# Patient Record
Sex: Male | Born: 1955
Health system: Southern US, Community
[De-identification: ages and names within clinical notes are randomized; demographics above are authoritative.]

---

## 2015-04-02 LAB — HEMOGLOBIN A1C
HEMOGLOBIN A1C: 5.3
Hemoglobin A1C: 5.3

## 2015-10-07 ENCOUNTER — Telehealth: Payer: Self-pay | Admitting: Family Medicine

## 2015-10-07 NOTE — Telephone Encounter (Signed)
New pt records received from [facility]. Pt has appt Monday 10/12/2015. Sending back for review. Please note what needs to be abstracted or scanned.

## 2015-10-12 ENCOUNTER — Ambulatory Visit (INDEPENDENT_AMBULATORY_CARE_PROVIDER_SITE_OTHER): Payer: Self-pay | Admitting: Family Medicine

## 2015-10-12 ENCOUNTER — Encounter: Payer: Self-pay | Admitting: Family Medicine

## 2015-10-12 VITALS — BP 118/76 | HR 65 | Wt 241.1 lb

## 2015-10-12 DIAGNOSIS — K8020 Calculus of gallbladder without cholecystitis without obstruction: Secondary | ICD-10-CM

## 2015-10-12 DIAGNOSIS — B182 Chronic viral hepatitis C: Secondary | ICD-10-CM | POA: Insufficient documentation

## 2015-10-12 NOTE — Progress Notes (Signed)
   Subjective:    Patient ID: Zachary Sutton, male    DOB: 1955-08-25, 60 y.o.   MRN: [id number]  HPI He is here for an initial visit. He was recently released from prison. He was diagnosed with hepatitis C in the mid 90s and given interferon for 1 year however did not get cured. He has been in the process of being evaluated for some maneuver medications however the prison system did not accommodate him in that regard. He did bring in all of his medical information. The medical information was reviewed. He does have type IA. Genotyping was done and he is sensitive to all the new hepatitis C regimens.C doesn't have insurance he is also checked on getting it free and would like me to prescribe it. Medical record also indicates that he does have cholelithiasis however he has had no GI symptoms.   Review of Systems     Objective:   Physical Exam  Alert and in no distress otherwise not examined His medical chart was reviewed.     Assessment & Plan:  Chronic hepatitis C without hepatic coma (HCC) case was discussed with the hepatitis C clinic. This does seem to be a fairly straightforward regimen and the side effects of the medication are quite minimal. I will review the information and expedite him getting the medication. Recommend that he come back after starting on the medication at 8 weeks for blood work and then 3 months after finishing it. I will discuss possible side effects of medications with him when I review the website more thoroughly.

## 2015-10-14 ENCOUNTER — Telehealth: Payer: Self-pay | Admitting: Family Medicine

## 2015-10-14 NOTE — Telephone Encounter (Signed)
Printed off Pt Assistance form for pt to complete for Zepatier for Hepatitis C treatment and this was mailed to pt

## 2015-10-15 ENCOUNTER — Telehealth: Payer: Self-pay | Admitting: Family Medicine

## 2015-10-15 NOTE — Telephone Encounter (Signed)
Called pt reached voice mail.  We need to know what pharmacy, we need patients signature on form and then we need to fax the form.

## 2015-10-20 NOTE — Telephone Encounter (Signed)
Pt returned Pt Assistance form, this was completed and faxed

## 2015-12-01 ENCOUNTER — Telehealth: Payer: Self-pay | Admitting: Family Medicine

## 2015-12-01 NOTE — Telephone Encounter (Signed)
PT ASSISTANCE ZEPATIER approved thru 10/25/16

## 2016-02-01 NOTE — Telephone Encounter (Signed)
Left message for pt, per [employer] patient is required to call in for his refills

## 2016-02-12 ENCOUNTER — Telehealth: Payer: Self-pay | Admitting: Family Medicine

## 2016-02-12 NOTE — Telephone Encounter (Signed)
Pt states he completed the Hep C medication 3 weeks ago, and he would like to see what his viral load is.  He wanted to know how much it would cost.  Let me know if he can have that drawn and which test it is and I will let him know the cost

## 2016-02-15 NOTE — Telephone Encounter (Signed)
Schedule him to come in for hep C viral load

## 2016-02-15 NOTE — Telephone Encounter (Signed)
He should follow up the Hep C clinic concerning this

## 2016-02-15 NOTE — Telephone Encounter (Signed)
Dr.Lalonde he has never been to Hep c clinic and all he wants is the blood test he still doesn't have ins.

## 2016-02-15 NOTE — Telephone Encounter (Signed)
Left message for pt to call me back 

## 2016-02-17 ENCOUNTER — Other Ambulatory Visit: Payer: Self-pay

## 2016-02-18 ENCOUNTER — Other Ambulatory Visit: Payer: Self-pay

## 2016-02-18 DIAGNOSIS — B1920 Unspecified viral hepatitis C without hepatic coma: Secondary | ICD-10-CM

## 2016-02-18 NOTE — Telephone Encounter (Signed)
Order has been place in future orders

## 2016-02-18 NOTE — Telephone Encounter (Signed)
Pt informed of order and cost of 61$ and with 55% discount test is 27.45

## 2016-02-26 ENCOUNTER — Other Ambulatory Visit: Payer: Self-pay

## 2016-02-26 DIAGNOSIS — B1920 Unspecified viral hepatitis C without hepatic coma: Secondary | ICD-10-CM

## 2016-02-26 DIAGNOSIS — B182 Chronic viral hepatitis C: Secondary | ICD-10-CM

## 2016-02-29 LAB — HEPATITIS C RNA QUANTITATIVE: HCV Quantitative: NOT DETECTED IU/mL (ref <15)

## 2016-09-06 ENCOUNTER — Encounter: Payer: Self-pay | Admitting: Family Medicine

## 2016-09-06 ENCOUNTER — Ambulatory Visit (INDEPENDENT_AMBULATORY_CARE_PROVIDER_SITE_OTHER): Payer: BLUE CROSS/BLUE SHIELD | Admitting: Family Medicine

## 2016-09-06 VITALS — BP 130/80 | HR 78 | Wt 251.0 lb

## 2016-09-06 DIAGNOSIS — J209 Acute bronchitis, unspecified: Secondary | ICD-10-CM | POA: Diagnosis not present

## 2016-09-06 DIAGNOSIS — S161XXA Strain of muscle, fascia and tendon at neck level, initial encounter: Secondary | ICD-10-CM

## 2016-09-06 MED ORDER — AMOXICILLIN 875 MG PO TABS: 875.0000 mg | ORAL_TABLET | Freq: Two times a day (BID) | ORAL | 0 refills | Status: DC

## 2016-09-06 MED ORDER — HYDROCOD POLST-CPM POLST ER 10-8 MG/5ML PO SUER: 5.0000 mL | Freq: Two times a day (BID) | ORAL | Status: DC | PRN
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
## 2016-09-06 NOTE — Patient Instructions (Signed)
Heat for 20 minutes 3 times per day with stretching after that for your neck. 2 Aleve twice per day for the neck pain.

## 2016-09-20 ENCOUNTER — Ambulatory Visit (INDEPENDENT_AMBULATORY_CARE_PROVIDER_SITE_OTHER): Payer: BLUE CROSS/BLUE SHIELD | Admitting: Family Medicine

## 2016-09-20 ENCOUNTER — Encounter: Payer: Self-pay | Admitting: Family Medicine

## 2016-09-20 VITALS — BP 126/80 | HR 78 | Resp 18 | Wt 251.2 lb

## 2016-09-20 DIAGNOSIS — S161XXA Strain of muscle, fascia and tendon at neck level, initial encounter: Secondary | ICD-10-CM | POA: Diagnosis not present

## 2016-09-20 DIAGNOSIS — J209 Acute bronchitis, unspecified: Secondary | ICD-10-CM | POA: Diagnosis not present

## 2016-09-20 DIAGNOSIS — N529 Male erectile dysfunction, unspecified: Secondary | ICD-10-CM

## 2016-09-20 MED ORDER — AMOXICILLIN-POT CLAVULANATE 875-125 MG PO TABS: 1.0000 | ORAL_TABLET | Freq: Two times a day (BID) | ORAL | 0 refills | Status: DC

## 2016-09-20 MED ORDER — SILDENAFIL CITRATE 20 MG PO TABS: 20.0000 mg | ORAL_TABLET | Freq: Three times a day (TID) | ORAL | 5 refills | Status: DC

## 2016-09-20 NOTE — Progress Notes (Signed)
   Subjective:    Patient ID: Zachary Sutton, male    DOB: 02/24/1956, 61 y.o.   MRN: [id number]  HPI He is here for a recheck. He is still having difficulty with coughing, postnasal drainage, nasal congestion but states that he is now 80% better. He is also still having difficulty with bilateral stiffness in his neck. No pain, numbness or tingling. He states his neck is roughly 80% better. He also has a previous history of difficulty with erectile dysfunction and did use Viagra in the past. He would like to see if he can get it at a less expensive price.  Review of Systems     Objective:   Physical Exam Alert and in no distress. Tympanic membranes and canals are normal. Pharyngeal area is normal. Neck is supple without adenopathy or thyromegaly. Cardiac exam shows a regular sinus rhythm without murmurs or gallops. Lungs are clear to auscultation. Full motion of the neck. Slight tenderness to palpation in the posterior cervical muscles with a trigger point present in the left trapezius.       Assessment & Plan:  Acute bronchitis, unspecified organism - Plan: amoxicillin-clavulanate (AUGMENTIN) 875-125 MG tablet  Neck strain, initial encounter  Erectile dysfunction, unspecified erectile dysfunction type - Plan: sildenafil (REVATIO) 20 MG tablet Prescription for sildenafil given. He will check with his pharmacy on the cost. Also recommend heat and stretching for his neck. He will return here in 2 weeks for reevaluation.

## 2016-09-20 NOTE — Patient Instructions (Signed)
Heat for 20 minutes 3 times per day and do some stretching after that.

## 2016-09-22 ENCOUNTER — Telehealth: Payer: Self-pay | Admitting: Family Medicine

## 2016-09-22 ENCOUNTER — Other Ambulatory Visit: Payer: Self-pay

## 2016-09-22 DIAGNOSIS — N529 Male erectile dysfunction, unspecified: Secondary | ICD-10-CM

## 2016-09-22 MED ORDER — SILDENAFIL CITRATE 20 MG PO TABS: 20.0000 mg | ORAL_TABLET | Freq: Three times a day (TID) | ORAL | 5 refills | Status: DC

## 2016-09-22 NOTE — Telephone Encounter (Signed)
sent 

## 2016-09-22 NOTE — Telephone Encounter (Signed)
Make it so

## 2016-09-22 NOTE — Telephone Encounter (Signed)
Patient called generic viagra rx has to be sent to [facility] [phone]  30 day supply

## 2016-10-11 ENCOUNTER — Ambulatory Visit: Payer: BLUE CROSS/BLUE SHIELD | Admitting: Family Medicine

## 2016-11-02 ENCOUNTER — Telehealth: Payer: Self-pay | Admitting: Family Medicine

## 2016-11-02 DIAGNOSIS — N529 Male erectile dysfunction, unspecified: Secondary | ICD-10-CM

## 2016-11-02 MED ORDER — SILDENAFIL CITRATE 20 MG PO TABS: 20.0000 mg | ORAL_TABLET | Freq: Three times a day (TID) | ORAL | 5 refills | Status: DC

## 2016-11-02 NOTE — Telephone Encounter (Signed)
Pt called and stated that he is having issues with insurance filling sildenafil. He would like a 30 day supply sent into [facility] [place] and he will pay for out of pocket.

## 2016-11-02 NOTE — Telephone Encounter (Signed)
Let him know that I called it in 

## 2016-11-03 NOTE — Telephone Encounter (Signed)
Left message on VM med called in

## 2016-11-05 ENCOUNTER — Telehealth: Payer: Self-pay | Admitting: Family Medicine

## 2016-11-05 NOTE — Telephone Encounter (Signed)
P.A. SILDENAFIL  

## 2016-11-13 NOTE — Telephone Encounter (Signed)
P.A. [place], Sildenafil is only approved to treat pulmonary arterial hypertension.  Can pt be switched to Sildenafil  #90 tabs  cash pay option for $95 from [facility]?

## 2016-11-14 NOTE — Telephone Encounter (Signed)
Called pt & informed, he has a coupon card thru [facility] to pay cash out of pocket and get them cheap

## 2016-11-14 NOTE — Telephone Encounter (Signed)
Let him know what the deal is

## 2017-06-02 DIAGNOSIS — Z23 Encounter for immunization: Secondary | ICD-10-CM | POA: Diagnosis not present

## 2017-09-13 ENCOUNTER — Ambulatory Visit (INDEPENDENT_AMBULATORY_CARE_PROVIDER_SITE_OTHER): Payer: 59 | Admitting: Family Medicine

## 2017-09-13 ENCOUNTER — Encounter: Payer: Self-pay | Admitting: Family Medicine

## 2017-09-13 VITALS — BP 150/80 | HR 68 | Resp 99 | Wt 272.8 lb

## 2017-09-13 DIAGNOSIS — L03116 Cellulitis of left lower limb: Secondary | ICD-10-CM | POA: Diagnosis not present

## 2017-09-13 DIAGNOSIS — J209 Acute bronchitis, unspecified: Secondary | ICD-10-CM

## 2017-09-13 DIAGNOSIS — L821 Other seborrheic keratosis: Secondary | ICD-10-CM | POA: Diagnosis not present

## 2017-09-13 DIAGNOSIS — R030 Elevated blood-pressure reading, without diagnosis of hypertension: Secondary | ICD-10-CM | POA: Diagnosis not present

## 2017-09-13 MED ORDER — DOXYCYCLINE HYCLATE 100 MG PO TABS: 100.0000 mg | ORAL_TABLET | Freq: Two times a day (BID) | ORAL | 0 refills | Status: DC

## 2017-09-13 MED ORDER — HYDROCOD POLST-CPM POLST ER 10-8 MG/5ML PO SUER: 5.0000 mL | Freq: Two times a day (BID) | ORAL | 0 refills | Status: DC | PRN

## 2017-09-13 NOTE — Progress Notes (Signed)
   Subjective:    Patient ID: Zachary Sutton, male    DOB: 07/30/1956, 62 y.o.   MRN: [id number]  HPI He complains of a 2-week history of cough that is occasionally productive with nasal congestion but no fever, chills, sore throat, earache, rhinorrhea or sneezing.  He has been using Tussionex for this.  He also has a lesion present on the left posterior leg that he would like evaluated.  It apparently got irritated when he wore his shoes and is now draining slightly.  He also has several lesions on his anus that he has concerns about.   Review of Systems     Objective:   Physical Exam Alert and in no distress. Tympanic membranes and canals are normal. Pharyngeal area is normal. Neck is supple without adenopathy or thyromegaly. Cardiac exam shows a regular sinus rhythm without murmurs or gallops. Lungs are clear to auscultation. Exam of the penis does show 2 well-demarcated pigmented lesions present on the ventral shaft approximately 1 cm in size. Exam of the left posterior distal calf does show an area of slight erythema and drainage.       Assessment & Plan:  Acute bronchitis, unspecified organism - Plan: doxycycline (VIBRA-TABS) 100 MG tablet  Cellulitis of left lower extremity  Elevated blood pressure reading  Seborrheic keratosis I will treat the bronchitis and the cellulitis with the Vibra-Tabs and see how he does.  He is to return here in about 10 days for reevaluation.  I will also reevaluate his blood pressure at that point.  Explained that the lesions on the penis are benign and unless they grow, nothing further needs to be done.

## 2017-09-14 ENCOUNTER — Telehealth: Payer: Self-pay

## 2017-09-14 NOTE — Telephone Encounter (Signed)
Pt called about his medicine Tussionex  not being in at the pharmacy. According to chart med a\was confimed at pharmacy @7 :30 pm . Will call the pharmacy to check. Thanks [employer]

## 2017-09-26 ENCOUNTER — Ambulatory Visit (INDEPENDENT_AMBULATORY_CARE_PROVIDER_SITE_OTHER): Payer: 59 | Admitting: Family Medicine

## 2017-09-26 ENCOUNTER — Encounter: Payer: Self-pay | Admitting: Family Medicine

## 2017-09-26 VITALS — BP 132/84 | HR 65 | Wt 271.4 lb

## 2017-09-26 DIAGNOSIS — R030 Elevated blood-pressure reading, without diagnosis of hypertension: Secondary | ICD-10-CM | POA: Diagnosis not present

## 2017-09-26 DIAGNOSIS — J209 Acute bronchitis, unspecified: Secondary | ICD-10-CM | POA: Diagnosis not present

## 2017-09-26 DIAGNOSIS — R635 Abnormal weight gain: Secondary | ICD-10-CM | POA: Diagnosis not present

## 2017-09-26 MED ORDER — AMOXICILLIN-POT CLAVULANATE 875-125 MG PO TABS
1.0000 | ORAL_TABLET | Freq: Two times a day (BID) | ORAL | 0 refills | Status: DC
Start: 2017-09-26 — End: 2019-09-13

## 2017-09-26 NOTE — Progress Notes (Signed)
   Subjective:    Patient ID: Zachary RegisterJoseph K Liebig, male    DOB: 05/31/1956, 62 y.o.   MRN: [id number]  HPI He is here for a recheck.  He does state that the cellulitis in his calf is now back to normal.  The cough is only about 50% better.  He continues to have difficulty with cough and congestion but no fever, chills, sore throat.  He also has a previous history of hepatitis C and did have it treated.  His last viral load was undetectable.  He does complain of weight gain over the last several years.  He is out of prison and now for the last several years been living with his wife.  He is concerned about his liver.  Also with his last evaluation, his blood pressure was slightly elevated.   Review of Systems     Objective:   Physical Exam Alert and in no distress. Tympanic membranes and canals are normal. Pharyngeal area is normal. Neck is supple without adenopathy or thyromegaly. Cardiac exam shows a regular sinus rhythm without murmurs or gallops. Lungs are clear to auscultation. Abdominal exam shows no masses or tenderness.  I do not really detect a fluid wave.  Exam of his calf does show a healing lesion.  Blood pressure is recorded.       Assessment & Plan:  Acute bronchitis, unspecified organism - Plan: amoxicillin-clavulanate (AUGMENTIN) 875-125 MG tablet  Elevated blood pressure reading  Weight gain - Plan: CBC with Differential/Platelet, Comprehensive metabolic panel Since his last viral load was undetectable I do not feel the need to check that again.  I will place him on Augmentin to help with his cough.  Discussed blood pressure and since he is borderline uncomfortable with waiting.

## 2017-09-27 LAB — CBC WITH DIFFERENTIAL/PLATELET
BASOS: 1 %
Basophils Absolute: 0 10*3/uL (ref 0.0–0.2)
EOS (ABSOLUTE): 0.2 10*3/uL (ref 0.0–0.4)
EOS: 2 %
Hematocrit: 39.2 % (ref 37.5–51.0)
Hemoglobin: 13.8 g/dL (ref 13.0–17.7)
IMMATURE GRANS (ABS): 0 10*3/uL (ref 0.0–0.1)
IMMATURE GRANULOCYTES: 1 %
LYMPHS: 27 %
Lymphocytes Absolute: 1.7 10*3/uL (ref 0.7–3.1)
MCH: 30.8 pg (ref 26.6–33.0)
MCHC: 35.2 g/dL (ref 31.5–35.7)
MCV: 88 fL (ref 79–97)
MONOCYTES: 6 %
Monocytes Absolute: 0.4 10*3/uL (ref 0.1–0.9)
NEUTROS PCT: 63 %
Neutrophils Absolute: 4 10*3/uL (ref 1.4–7.0)
PLATELETS: 284 10*3/uL (ref 150–379)
RBC: 4.48 x10E6/uL (ref 4.14–5.80)
RDW: 13.4 % (ref 12.3–15.4)
WBC: 6.3 10*3/uL (ref 3.4–10.8)

## 2017-09-27 LAB — COMPREHENSIVE METABOLIC PANEL
ALT: 21 IU/L (ref 0–44)
AST: 19 IU/L (ref 0–40)
Albumin/Globulin Ratio: 1.3 (ref 1.2–2.2)
Albumin: 4.1 g/dL (ref 3.6–4.8)
Alkaline Phosphatase: 48 IU/L (ref 39–117)
BUN/Creatinine Ratio: 15 (ref 10–24)
BUN: 13 mg/dL (ref 8–27)
Bilirubin Total: 0.3 mg/dL (ref 0.0–1.2)
CALCIUM: 9.2 mg/dL (ref 8.6–10.2)
CO2: 25 mmol/L (ref 20–29)
CREATININE: 0.85 mg/dL (ref 0.76–1.27)
Chloride: 99 mmol/L (ref 96–106)
GFR, EST AFRICAN AMERICAN: 109 mL/min/{1.73_m2} (ref >59)
GFR, EST NON AFRICAN AMERICAN: 95 mL/min/{1.73_m2} (ref >59)
GLUCOSE: 99 mg/dL (ref 65–99)
Globulin, Total: 3.1 g/dL (ref 1.5–4.5)
Potassium: 4.5 mmol/L (ref 3.5–5.2)
Sodium: 139 mmol/L (ref 134–144)
TOTAL PROTEIN: 7.2 g/dL (ref 6.0–8.5)

## 2017-09-28 ENCOUNTER — Encounter: Payer: Self-pay | Admitting: Family Medicine

## 2018-01-05 ENCOUNTER — Encounter: Payer: Self-pay | Admitting: Family Medicine

## 2018-01-12 ENCOUNTER — Other Ambulatory Visit: Payer: Self-pay | Admitting: Family Medicine

## 2018-06-16 ENCOUNTER — Other Ambulatory Visit: Payer: Self-pay | Admitting: Family Medicine

## 2018-06-16 DIAGNOSIS — N529 Male erectile dysfunction, unspecified: Secondary | ICD-10-CM

## 2018-06-18 NOTE — Telephone Encounter (Signed)
[facility] Please advise KH 

## 2018-09-18 ENCOUNTER — Ambulatory Visit (INDEPENDENT_AMBULATORY_CARE_PROVIDER_SITE_OTHER): Payer: 59 | Admitting: Family Medicine

## 2018-09-18 DIAGNOSIS — S63632A Sprain of interphalangeal joint of right middle finger, initial encounter: Secondary | ICD-10-CM

## 2018-09-18 DIAGNOSIS — G4700 Insomnia, unspecified: Secondary | ICD-10-CM

## 2018-09-18 MED ORDER — ALPRAZOLAM 0.5 MG PO TABS: 0.5000 mg | ORAL_TABLET | Freq: Two times a day (BID) | ORAL | 0 refills | Status: DC | PRN

## 2018-09-18 NOTE — Patient Instructions (Signed)
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
## 2018-10-16 ENCOUNTER — Other Ambulatory Visit: Payer: Self-pay | Admitting: Family Medicine

## 2018-10-16 DIAGNOSIS — G4700 Insomnia, unspecified: Secondary | ICD-10-CM

## 2018-10-16 MED ORDER — ALPRAZOLAM 0.5 MG PO TABS: 0.5000 mg | ORAL_TABLET | Freq: Two times a day (BID) | ORAL | 1 refills | Status: DC | PRN

## 2018-10-16 NOTE — Telephone Encounter (Signed)
Zachary Sutton is requesting to fill pt xanax Please advise KH 

## 2018-12-28 ENCOUNTER — Ambulatory Visit (INDEPENDENT_AMBULATORY_CARE_PROVIDER_SITE_OTHER): Payer: 59 | Admitting: Family Medicine

## 2018-12-28 ENCOUNTER — Encounter: Payer: Self-pay | Admitting: Family Medicine

## 2018-12-28 ENCOUNTER — Other Ambulatory Visit: Payer: Self-pay

## 2018-12-28 DIAGNOSIS — S63632D Sprain of interphalangeal joint of right middle finger, subsequent encounter: Secondary | ICD-10-CM
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
## 2019-01-23 ENCOUNTER — Other Ambulatory Visit: Payer: Self-pay | Admitting: Family Medicine

## 2019-01-23 DIAGNOSIS — G4700 Insomnia, unspecified: Secondary | ICD-10-CM

## 2019-01-23 NOTE — Telephone Encounter (Signed)
Is this okay to refill? 

## 2019-02-04 ENCOUNTER — Other Ambulatory Visit: Payer: Self-pay

## 2019-02-04 ENCOUNTER — Encounter: Payer: Self-pay | Admitting: Family Medicine

## 2019-02-04 ENCOUNTER — Ambulatory Visit (INDEPENDENT_AMBULATORY_CARE_PROVIDER_SITE_OTHER): Payer: 59 | Admitting: Family Medicine

## 2019-02-04 ENCOUNTER — Other Ambulatory Visit: Payer: Self-pay | Admitting: Family Medicine

## 2019-02-04 ENCOUNTER — Telehealth: Payer: Self-pay | Admitting: *Deleted

## 2019-02-04 VITALS — Temp 102.0°F | Wt 245.0 lb

## 2019-02-04 DIAGNOSIS — R509 Fever, unspecified: Secondary | ICD-10-CM

## 2019-02-04 DIAGNOSIS — Z20822 Contact with and (suspected) exposure to covid-19: Secondary | ICD-10-CM

## 2019-02-04 NOTE — Patient Instructions (Signed)
Keep well-hydrated.  Go ahead and use aspirin for fever and chills.

## 2019-02-04 NOTE — Progress Notes (Signed)
   Subjective:    Patient ID: Zachary Sutton, male    DOB: May 26, 1956, 63 y.o.   MRN: [id number]  HPI Documentation for virtual telephone encounter.  Documentation for virtual audio and video telecommunications through Patrick encounter:  Interactive audio and video telecommunications were attempted between this provider and patient, however due to patient 5, they did not have access to video capability.  We continued and completed visit with audio only. The patient was located at home. The provider was located in the office. The patient did consent to this visit and is aware of possible charges through their insurance for this visit. The other persons participating in this telemedicine service were none. Time spent on call was 5 minutes and in review of previous records >10 minutes total.  This virtual service is not related to other E/M service within previous 7 days. He complains of a 2-day history of fever, chills, fatigue.  Temperature has gone up to 102.  No cough, congestion, shortness of breath, diarrhea, earache, smell or taste difference.  He would like to be COVID tested.    Review of Systems     Objective:   Physical Exam Alert and in no distress.  He visually appears slightly toxic.       Assessment & Plan:  Fever and chills - Plan: Order placed for COVID testing Keep well-hydrated.  Go ahead and use aspirin for fever and chills.

## 2019-02-04 NOTE — Telephone Encounter (Signed)
LM for patient to call back to schedule covid testing @ [phone] M-F 7a-7p. Order placed

## 2019-02-04 NOTE — Telephone Encounter (Signed)
Patient called and says he needs to schedule his covid test. Appointment scheduled for today at 36 at [facility], advised of location and to wear a mask for everyone in the vehicle, he verbalized understanding.

## 2019-02-04 NOTE — Addendum Note (Signed)
Addended by: Denita Lung on: 02/04/2019 12:13 PM   Modules accepted: Orders

## 2019-02-04 NOTE — Addendum Note (Signed)
Addended byMatilde Sprang on: 02/04/2019 12:02 PM   Modules accepted: Orders

## 2019-02-04 NOTE — Telephone Encounter (Signed)
-----   Message from Denita Lung, MD sent at 02/04/2019 10:25 AM EDT ----- 2-day history of fever, chills, fatigue but not short of breath or coughing.  Needs COVID testing.

## 2019-02-07 LAB — NOVEL CORONAVIRUS, NAA: SARS-CoV-2, NAA: NOT DETECTED

## 2019-04-20 ENCOUNTER — Other Ambulatory Visit: Payer: Self-pay | Admitting: Family Medicine

## 2019-04-20 DIAGNOSIS — G4700 Insomnia, unspecified: Secondary | ICD-10-CM

## 2019-05-15 ENCOUNTER — Other Ambulatory Visit: Payer: Self-pay | Admitting: Family Medicine

## 2019-05-15 DIAGNOSIS — N529 Male erectile dysfunction, unspecified: Secondary | ICD-10-CM

## 2019-05-15 NOTE — Telephone Encounter (Signed)
Is this okay to refill? 

## 2019-06-13 ENCOUNTER — Other Ambulatory Visit: Payer: Self-pay | Admitting: Family Medicine

## 2019-06-13 DIAGNOSIS — G4700 Insomnia, unspecified: Secondary | ICD-10-CM

## 2019-06-13 NOTE — Telephone Encounter (Signed)
[facility] is requesting to fill pt xanax . Please advise KH 

## 2019-09-13 ENCOUNTER — Encounter: Payer: Self-pay | Admitting: Family Medicine

## 2019-09-13 ENCOUNTER — Ambulatory Visit: Payer: 59 | Admitting: Family Medicine

## 2019-09-13 ENCOUNTER — Other Ambulatory Visit: Payer: Self-pay

## 2019-09-13 VITALS — Wt 235.0 lb

## 2019-09-13 DIAGNOSIS — N50812 Left testicular pain: Secondary | ICD-10-CM

## 2019-09-13 DIAGNOSIS — R197 Diarrhea, unspecified: Secondary | ICD-10-CM

## 2019-09-13 DIAGNOSIS — N50811 Right testicular pain: Secondary | ICD-10-CM

## 2019-09-13 NOTE — Progress Notes (Signed)
   Subjective:  Documentation for virtual audio and video telecommunications through [employer] encounter:  The patient was located at work. 2 patient identifiers used.  The provider was located in the office. The patient did consent to this visit and is aware of possible charges through their insurance for this visit.  The other persons participating in this telemedicine service were none.    Patient ID: Zachary Sutton, male    DOB: April 16, 1956, 64 y.o.   MRN: [id number]  HPI Chief Complaint  Patient presents with  . testsicle    testicle pain- intermittent for the last week, not swollen, hurts when sometimes walking or standing long. no blood. diarrhea- but says its not related. not sure if its a hernia or not. sometimes when coughing hard, he can feel it in the right side   He normally sees Dr. Susann Givens.  Complains of a 2 week history of bilateral testicular pain. Pain is described as a dull ache. Pain is intermittent and worse on the right when present. Pain is nonradiating. Standing long periods and certain movements makes his pain worse. Pain improves with resting or sitting.  Denies swelling, tenderness. States he has been palpating his testicles and there is no tenderness or masses. States he masturbated yesterday and hs semen looked normal.  He does have some concerned that he may have a hernia although he has not noticed a bulge.  He did not come into the office today due to him being at work and hs complaint of diarrhea in the setting of Covid pandemic. States he has loose stools from time to time related to food. Thinks this is related to something he ate. He took imodium and no longer having diarrhea.   Denies fever, chills, dizziness, chest pain, palpitations, shortness of breath, abdominal pain, back pain, N/V, or urinary symptoms.   Reviewed allergies, medications, past medical, surgical, family, and social history.    Review of Systems Pertinent positives and negatives in  the history of present illness.     Objective:   Physical Exam Wt 235 lb (106.6 kg)   Alert and oriented and in no acute distress.  Respirations unlabored.  Normal speech, mood and thought process.      Assessment & Plan:  Pain in both testicles  Diarrhea, unspecified type  Discussed limitations of a virtual visit especially for the complaint of testicular pain.  There does not appear to be any red flag symptoms such as testicular torsion and we did discuss the symptoms of this and the fact that this is an emergency. His pain is intermittent and associated with certain movements. No urinary symptoms.  He has concerns that he may have a hernia.  I discussed avoiding any strenuous activity until he is evaluated.  I also discussed symptoms of incarceration or strangulation of a hernia and that this was also an emergency. Diarrhea appears to be associated to something he ate and he does have this from time to time after eating certain foods. He has no other COVID-19 symptoms. Recommend he come into the office Monday and he will see his PCP, Dr. Susann Givens.  Discussed that if his symptoms worsen over the weekend that he would need to go to urgent care or the emergency department and he agrees to this plan.  Time spent on call was 18  minutes and in review of previous records 25 minutes total.  This virtual service is not related to other E/M service within previous 7 days.

## 2019-09-16 ENCOUNTER — Ambulatory Visit (INDEPENDENT_AMBULATORY_CARE_PROVIDER_SITE_OTHER): Payer: 59 | Admitting: Family Medicine

## 2019-09-16 ENCOUNTER — Encounter: Payer: Self-pay | Admitting: Family Medicine

## 2019-09-16 ENCOUNTER — Other Ambulatory Visit: Payer: Self-pay

## 2019-09-16 ENCOUNTER — Ambulatory Visit
Admission: RE | Admit: 2019-09-16 | Discharge: 2019-09-16 | Disposition: A | Discharge status: Home / Self Care | Payer: 59 | Source: Ambulatory Visit | Attending: Family Medicine | Admitting: Family Medicine

## 2019-09-16 VITALS — BP 158/90 | HR 72 | Temp 96.9°F | Wt 241.4 lb

## 2019-09-16 DIAGNOSIS — N50812 Left testicular pain: Secondary | ICD-10-CM

## 2019-09-16 DIAGNOSIS — N50811 Right testicular pain: Secondary | ICD-10-CM | POA: Diagnosis not present

## 2019-09-16 DIAGNOSIS — R1031 Right lower quadrant pain: Secondary | ICD-10-CM

## 2019-09-16 NOTE — Progress Notes (Signed)
   Subjective:    Patient ID: DRAKEN FARRIOR, male    DOB: 06-11-1956, 64 y.o.   MRN: [id number]  HPI He complains of a 1 week history of testicular pain, right greater than left.  He notes that it gets worse with standing and also gets worse when he goes from the sitting to standing position.  He also states that he feels the discomfort in the right lateral calf area that radiates into the right groin.  He does not complain of low back pain, urinary incontinence, dysuria, frequency.  No problems with sexual activity.  No previous history of back pain.  Review of Systems     Objective:   Physical Exam Alert and in no distress.  No palpable tenderness to the lumbar area however with flexion, he does complain of calf discomfort.  Straight leg raising did cause some discomfort at 45 degrees however static stretch was negative.  Normal sensation.  DTRs are normal.  No clonus noted.  Testes are normal.  Hip motion is normal.       Assessment & Plan:  Pain in both testicles - Plan: DG Lumbar Spine Complete  Right groin pain - Plan: DG Lumbar Spine Complete His symptoms revolve more around back motion cause any discomfort from the calf into the groin area.  He is not really having any bowel or bladder discomfort.  Not clear-cut.

## 2019-10-23 ENCOUNTER — Other Ambulatory Visit: Payer: Self-pay | Admitting: Family Medicine

## 2019-10-23 DIAGNOSIS — G4700 Insomnia, unspecified: Secondary | ICD-10-CM

## 2019-10-23 NOTE — Telephone Encounter (Signed)
[facility] is requesting to fill pt xanax . Please advise KH 

## 2019-12-02 ENCOUNTER — Telehealth: Payer: Self-pay

## 2019-12-02 NOTE — Telephone Encounter (Signed)
Have him set up a virtual visit so we can handle this.

## 2019-12-02 NOTE — Telephone Encounter (Signed)
Pt. Called stating he needed to speak with you about some refills on medication, I asked him what he needed refilled and he said that is gets bronchitis and he has it now and he needs the anti biotic called in that you called in last time he had bronchitis, pt. Stated that he is coughing a lot.

## 2019-12-02 NOTE — Telephone Encounter (Signed)
Pt. Has been scheduled for a virtual tomorrow.

## 2019-12-03 ENCOUNTER — Encounter: Payer: Self-pay | Admitting: Family Medicine

## 2019-12-03 ENCOUNTER — Other Ambulatory Visit: Payer: Self-pay

## 2019-12-03 ENCOUNTER — Ambulatory Visit (INDEPENDENT_AMBULATORY_CARE_PROVIDER_SITE_OTHER): Payer: 59 | Admitting: Family Medicine

## 2019-12-03 ENCOUNTER — Telehealth: Payer: Self-pay | Admitting: Family Medicine

## 2019-12-03 VITALS — Temp 97.9°F | Wt 241.0 lb

## 2019-12-03 DIAGNOSIS — J209 Acute bronchitis, unspecified: Secondary | ICD-10-CM

## 2019-12-03 MED ORDER — AMOXICILLIN-POT CLAVULANATE 875-125 MG PO TABS: 1.0000 | ORAL_TABLET | Freq: Two times a day (BID) | ORAL | 0 refills | Status: DC

## 2019-12-03 MED ORDER — AMOXICILLIN-POT CLAVULANATE 875-125 MG PO TABS: 1.0000 | ORAL_TABLET | Freq: Two times a day (BID) | ORAL | 0 refills | Status: AC

## 2019-12-03 MED ORDER — HYDROCOD POLST-CPM POLST ER 10-8 MG/5ML PO SUER: 5.0000 mL | Freq: Two times a day (BID) | ORAL | 0 refills | Status: AC | PRN

## 2019-12-03 MED ORDER — HYDROCOD POLST-CPM POLST ER 10-8 MG/5ML PO SUER: 5.0000 mL | Freq: Two times a day (BID) | ORAL | 0 refills | Status: DC | PRN

## 2019-12-03 NOTE — Telephone Encounter (Signed)
The meds need to be switched to another store due to insurance

## 2019-12-03 NOTE — Telephone Encounter (Signed)
[facility] not accepted [facility] so he needs the Augmentin and Tussionex switched to the [facility] on [address]. In [place].

## 2019-12-03 NOTE — Progress Notes (Signed)
   Subjective:    Patient ID: Zachary Sutton, male    DOB: 12-Aug-1956, 64 y.o.   MRN: [id number]  HPI  Interactive audio and video telecommunications were attempted between this provider and patient, however due to caregility not working properly,we continued and completed visit with audio only.  The patient was located at home. The provider was located in the office. The patient did consent to this visit and is aware of possible charges through their insurance for this visit. The other persons participating in this telemedicine service were none. Time spent on call was 2 minutes and in review of previous records >13 minutes total. This virtual service is not related to other E/M service within previous 7 days. He states that he started having difficulty with fever, nausea and fatigue approximately 10 days ago.  Approximately 6 days ago he had the onset of a dry hacking cough as well as continued difficulty with fever and fatigue.  No shortness of breath, smell or taste change.  He has been using Naprosyn to help with the discomfort.  He is also taking Tussionex that he had leftover from previous bronchitis.  He states that Augmentin worked for that one.  He apparently is unable to work due to the coughing.    Review of Systems     Objective:   Physical Exam Alert and in no distress.  Coughing heard while talking.       Assessment & Plan:  Acute bronchitis, unspecified organism - Plan: amoxicillin-clavulanate (AUGMENTIN) 875-125 MG tablet, chlorpheniramine-HYDROcodone (TUSSIONEX PENNKINETIC ER) 10-8 MG/5ML SUER Encouraged him to get Covid testing.  He will call if he continues have difficulty.

## 2019-12-24 ENCOUNTER — Telehealth: Payer: Self-pay | Admitting: Family Medicine

## 2019-12-24 NOTE — Telephone Encounter (Signed)
[facility] called and states that pt was there today and was needing a referral today there office can be reached at [phone]

## 2019-12-24 NOTE — Telephone Encounter (Signed)
ok 

## 2019-12-26 NOTE — Telephone Encounter (Signed)
Spoke to laser eye office and advised that we can not put in a retro referral . Pt eye doctor should have contacted our office to advise of the need before the visit. KH

## 2020-01-23 NOTE — Telephone Encounter (Signed)
Spoke to [facility] and was advised they need a retro referral from 12-24-19. She was advised that we do not do them. She was not happy about this and proceeded to advise of what she thought was the process of a referral. She was advised of the proper process and than advised that we still could not help with this due to our office not having any prior knowledge what he was being seen for. She then told me that it was Dr. Lorin Picket from [facility] who referred the pt. Than she requested a referral for pt surgery that is set up 01-27-20 still not advising what pt is being seen for. I called [facility]. Which agrees and advised [facility] that they needed to call our office before the pt was seen. She also advised that their office does not do retro referrals either. LVM to [facility] to please send over notes so we can get a referral to them tomorrow . Could not send a fax due to their office being closed for lunch. KH

## 2020-01-24 ENCOUNTER — Other Ambulatory Visit: Payer: Self-pay

## 2020-02-23 ENCOUNTER — Other Ambulatory Visit: Payer: Self-pay | Admitting: Family Medicine

## 2020-02-23 DIAGNOSIS — G4700 Insomnia, unspecified: Secondary | ICD-10-CM

## 2020-02-26 ENCOUNTER — Other Ambulatory Visit: Payer: Self-pay | Admitting: Family Medicine

## 2020-02-26 ENCOUNTER — Telehealth: Payer: Self-pay

## 2020-02-26 NOTE — Telephone Encounter (Signed)
Please let him know that Dr. Susann Givens is out of town until next week. I gave him a refill of 10 tablets until Dr. Susann Givens gets back. Looks like he needs a visit to discuss anxiety

## 2020-02-26 NOTE — Telephone Encounter (Signed)
Tried to call pt multiple times and it will only ring once then go silent

## 2020-02-26 NOTE — Telephone Encounter (Signed)
Received a fax from [facility] for a refill on the pts. Alprazolam pt. Last apt. Was 12/03/19 and has no future apts.

## 2020-02-27 NOTE — Telephone Encounter (Signed)
Pt coming in next week

## 2020-03-03 ENCOUNTER — Encounter: Payer: Self-pay | Admitting: Internal Medicine

## 2020-03-03 ENCOUNTER — Encounter: Payer: 59 | Admitting: Family Medicine

## 2020-03-03 DIAGNOSIS — G4700 Insomnia, unspecified: Secondary | ICD-10-CM

## 2020-03-03 MED ORDER — ALPRAZOLAM 0.5 MG PO TABS: ORAL_TABLET | ORAL | 1 refills | Status: DC

## 2020-05-14 ENCOUNTER — Other Ambulatory Visit: Payer: Self-pay | Admitting: Family Medicine

## 2020-05-14 DIAGNOSIS — N529 Male erectile dysfunction, unspecified: Secondary | ICD-10-CM

## 2020-05-15 NOTE — Telephone Encounter (Signed)
[facility] Please advise KH 

## 2020-08-09 IMAGING — CR DG LUMBAR SPINE COMPLETE 4+V
5 series · 5 of 5 positions shown · non-contrast
Comparison: None.

CLINICAL DATA: Right leg pain for several days

EXAM:
LUMBAR SPINE - COMPLETE 4+ VIEW

[t l-spine a.p.]
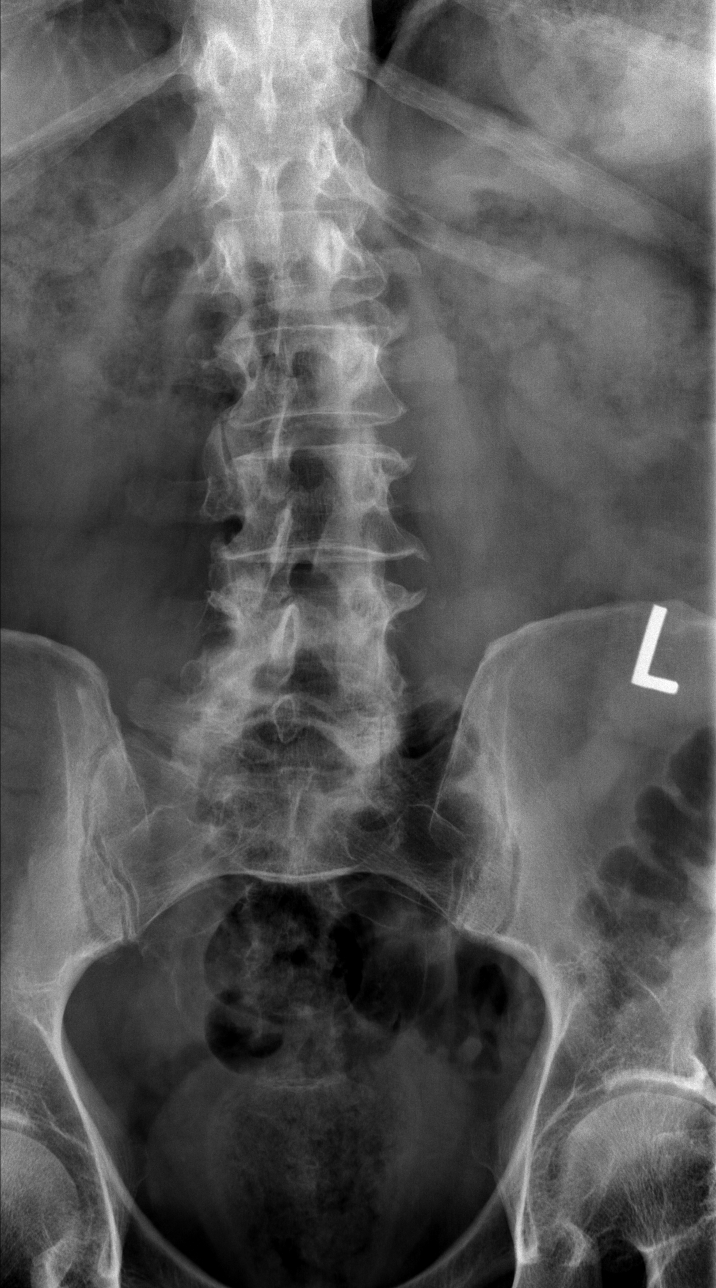

[t l-spine oblique exposure (1 of 2)]
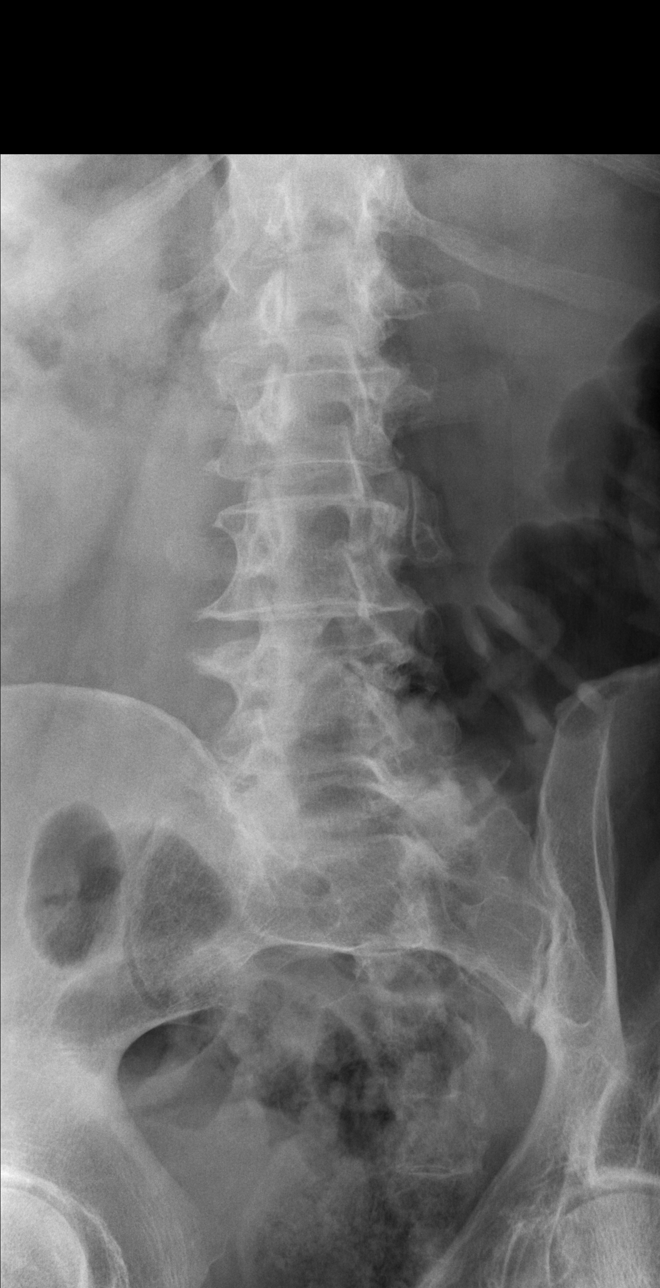

[t l-spine oblique exposure (2 of 2)]
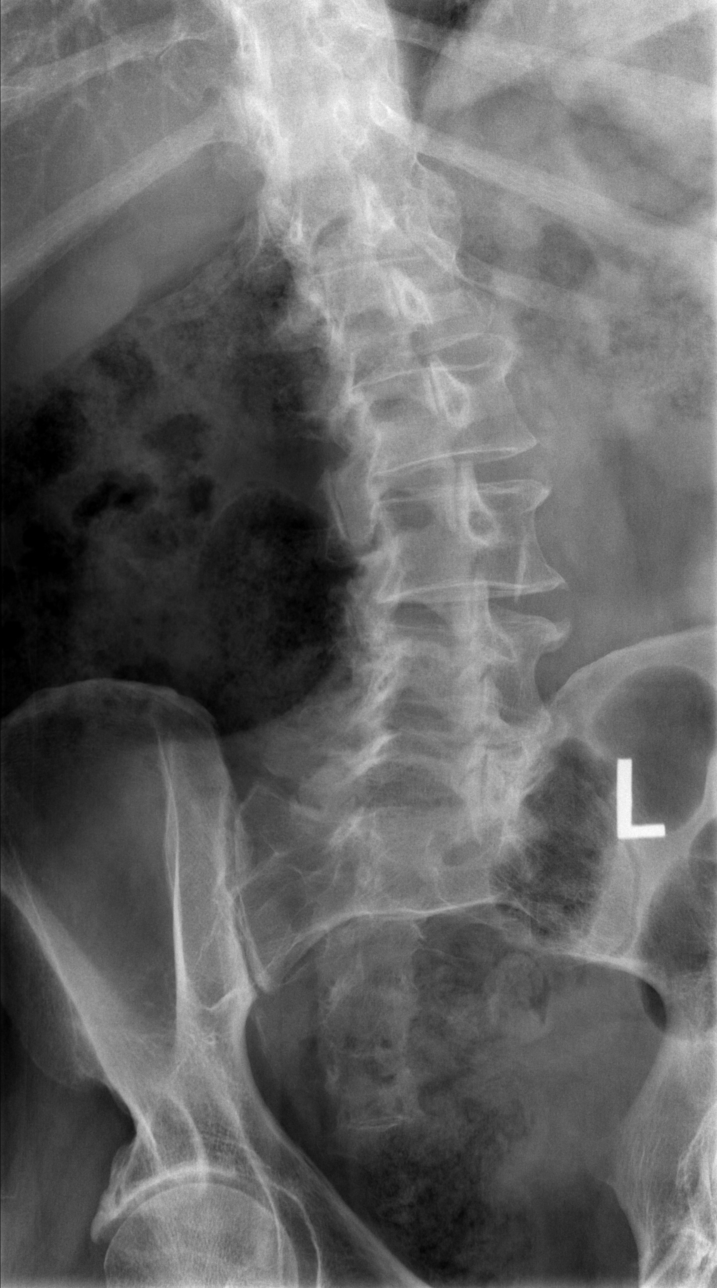

[t l-spine lat]
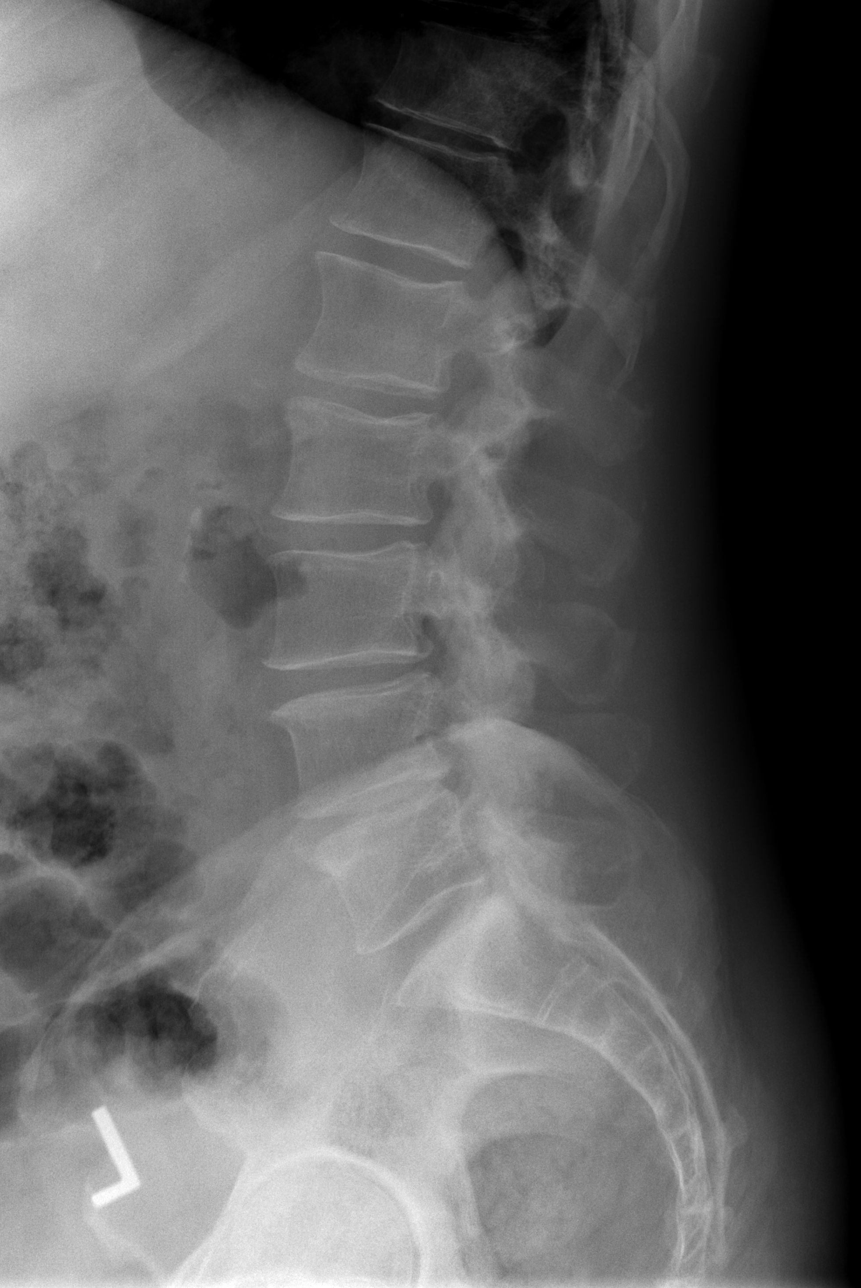

[t l-spine l5-s1 spot]
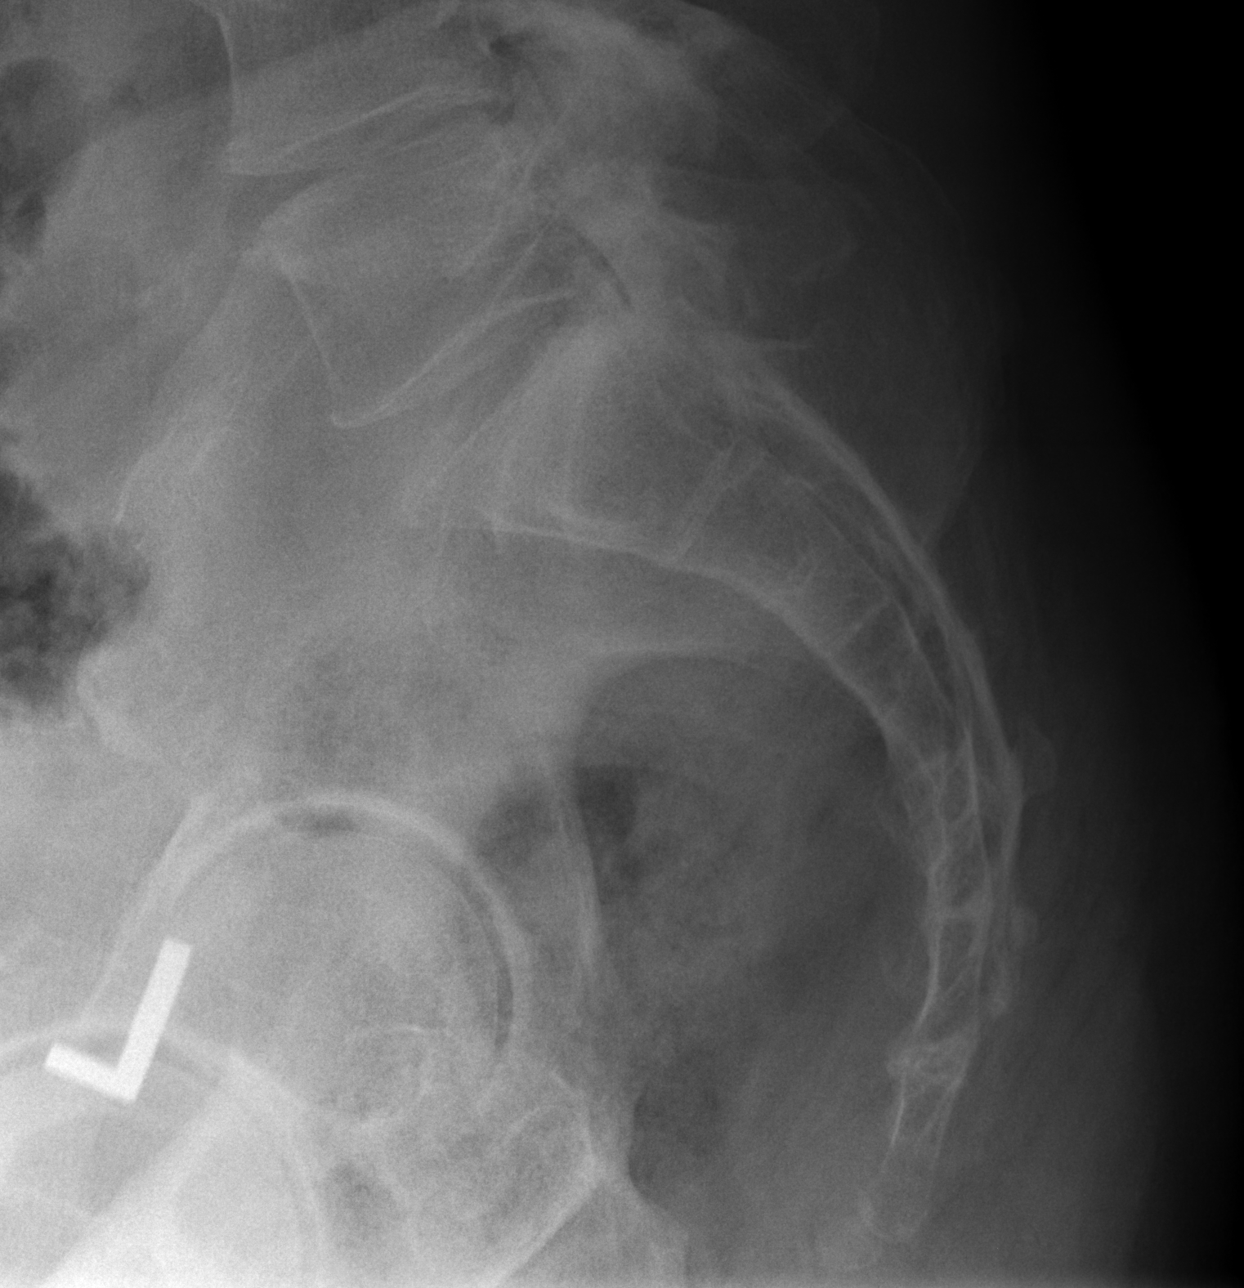

[5 of 5 positions shown; findings below may reference images not displayed]

FINDINGS: Five lumbar type vertebral bodies are well visualized. Vertebral
body height is well maintained. Mild osteophytic changes are seen.
Disc space narrowing is noted at L4-5. No anterolisthesis is noted.
IMPRESSION: Degenerative change without acute abnormality.

## 2020-09-07 ENCOUNTER — Telehealth: Payer: Self-pay

## 2020-09-07 DIAGNOSIS — G4700 Insomnia, unspecified: Secondary | ICD-10-CM

## 2020-09-07 MED ORDER — ALPRAZOLAM 0.5 MG PO TABS: ORAL_TABLET | ORAL | 1 refills | Status: AC

## 2020-09-07 NOTE — Telephone Encounter (Signed)
Pt. Requesting refill on Alprazolam pt. Last apt was 03/03/20.

## 2020-09-29 ENCOUNTER — Telehealth: Payer: Self-pay

## 2020-09-29 NOTE — Telephone Encounter (Signed)
Pt. Called stating that he fell in the bathroom yesterday hit his back on the tub. He said his rt. Lower side of his back hurts really bad to the touch not to bad if sitting down but if he turns a certain way hurts pretty bad. He wanted to know if you thought it would be worth him getting it x-rayed or not. The area that is hurting is right above the kidneys on the rt. Side of lower back.

## 2020-09-29 NOTE — Telephone Encounter (Signed)
Pt. Aware of recommendations.  

## 2020-09-29 NOTE — Telephone Encounter (Signed)
The main thing right now would be to take some ibuprofen for the pain however if he has difficulty with shortness of breath, nausea, vomiting abdominal pain that changes everything and he will need to be seen

## 2020-12-18 ENCOUNTER — Emergency Department (HOSPITAL_COMMUNITY)
Admission: EM | Admit: 2020-12-18 | Discharge: 2020-12-18 | Disposition: A | Discharge status: Home / Self Care | Payer: 59 | Attending: Emergency Medicine | Admitting: Emergency Medicine

## 2020-12-18 ENCOUNTER — Emergency Department (HOSPITAL_COMMUNITY): Payer: 59

## 2020-12-18 DIAGNOSIS — R4182 Altered mental status, unspecified: Secondary | ICD-10-CM | POA: Diagnosis not present

## 2020-12-18 DIAGNOSIS — D72829 Elevated white blood cell count, unspecified: Secondary | ICD-10-CM | POA: Diagnosis not present

## 2020-12-18 DIAGNOSIS — F1910 Other psychoactive substance abuse, uncomplicated: Secondary | ICD-10-CM

## 2020-12-18 DIAGNOSIS — Z20822 Contact with and (suspected) exposure to covid-19: Secondary | ICD-10-CM | POA: Insufficient documentation

## 2020-12-18 LAB — CBC WITH DIFFERENTIAL/PLATELET
Abs Immature Granulocytes: 0.04 10*3/uL (ref 0.00–0.07)
Basophils Absolute: 0.1 10*3/uL (ref 0.0–0.1)
Basophils Relative: 1 %
Eosinophils Absolute: 0 10*3/uL (ref 0.0–0.5)
Eosinophils Relative: 0 %
HCT: 44.3 % (ref 39.0–52.0)
Hemoglobin: 14.6 g/dL (ref 13.0–17.0)
Immature Granulocytes: 0 %
Lymphocytes Relative: 6 %
Lymphs Abs: 0.7 10*3/uL (ref 0.7–4.0)
MCH: 30.5 pg (ref 26.0–34.0)
MCHC: 33 g/dL (ref 30.0–36.0)
MCV: 92.5 fL (ref 80.0–100.0)
Monocytes Absolute: 0.7 10*3/uL (ref 0.1–1.0)
Monocytes Relative: 6 %
Neutro Abs: 10.4 10*3/uL — ABNORMAL HIGH (ref 1.7–7.7)
Neutrophils Relative %: 87 %
Platelets: 279 10*3/uL (ref 150–400)
RBC: 4.79 MIL/uL (ref 4.22–5.81)
RDW: 13.1 % (ref 11.5–15.5)
WBC: 11.9 10*3/uL — ABNORMAL HIGH (ref 4.0–10.5)
nRBC: 0 % (ref 0.0–0.2)

## 2020-12-18 LAB — COMPREHENSIVE METABOLIC PANEL
ALT: 21 U/L (ref 0–44)
AST: 35 U/L (ref 15–41)
Albumin: 4.4 g/dL (ref 3.5–5.0)
Alkaline Phosphatase: 56 U/L (ref 38–126)
Anion gap: 7 (ref 5–15)
BUN: 24 mg/dL — ABNORMAL HIGH (ref 8–23)
CO2: 27 mmol/L (ref 22–32)
Calcium: 9 mg/dL (ref 8.9–10.3)
Chloride: 103 mmol/L (ref 98–111)
Creatinine, Ser: 1.03 mg/dL (ref 0.61–1.24)
GFR, Estimated: >60 mL/min (ref >60)
Glucose, Bld: 134 mg/dL — ABNORMAL HIGH (ref 70–99)
Potassium: 4.9 mmol/L (ref 3.5–5.1)
Sodium: 137 mmol/L (ref 135–145)
Total Bilirubin: 1.2 mg/dL (ref 0.3–1.2)
Total Protein: 7.5 g/dL (ref 6.5–8.1)

## 2020-12-18 LAB — RESP PANEL BY RT-PCR (FLU A&B, COVID) ARPGX2
Influenza A by PCR: NEGATIVE
Influenza B by PCR: NEGATIVE
SARS Coronavirus 2 by RT PCR: NEGATIVE

## 2020-12-18 LAB — LIPASE, BLOOD: Lipase: 46 U/L (ref 11–51)

## 2020-12-18 LAB — RAPID URINE DRUG SCREEN, HOSP PERFORMED
Amphetamines: POSITIVE — AB
Barbiturates: NOT DETECTED
Benzodiazepines: NOT DETECTED
Cocaine: POSITIVE — AB
Opiates: NOT DETECTED
Tetrahydrocannabinol: POSITIVE — AB

## 2020-12-18 LAB — ETHANOL: Alcohol, Ethyl (B): <10 mg/dL (ref <10)

## 2020-12-18 LAB — PROTIME-INR
INR: 1 (ref 0.8–1.2)
Prothrombin Time: 13 seconds (ref 11.4–15.2)

## 2020-12-18 LAB — CBG MONITORING, ED: Glucose-Capillary: 135 mg/dL — ABNORMAL HIGH (ref 70–99)

## 2020-12-18 MED ORDER — NALOXONE HCL 0.4 MG/ML IJ SOLN
0.4000 mg | Freq: Once | INTRAMUSCULAR | Status: AC
  Administered 2020-12-18: 0.4 mg via INTRAVENOUS
  Filled 2020-12-18: qty 1

## 2020-12-18 MED ORDER — SODIUM CHLORIDE 0.9 % IV BOLUS
500.0000 mL | Freq: Once | INTRAVENOUS | Status: AC
  Administered 2020-12-18: 500 mL via INTRAVENOUS

## 2020-12-18 MED ORDER — SODIUM CHLORIDE 0.9 % IV SOLN
INTRAVENOUS | Status: DC
  Administered 2020-12-18: 1000 mL via INTRAVENOUS

## 2020-12-18 MED ORDER — ONDANSETRON HCL 4 MG/2ML IJ SOLN
4.0000 mg | Freq: Once | INTRAMUSCULAR | Status: AC
  Administered 2020-12-18: 4 mg via INTRAVENOUS
  Filled 2020-12-18: qty 2

## 2020-12-18 NOTE — ED Triage Notes (Signed)
Pt bib ems from home with reports of altered mental status. Pt admits having 3 beers possibly this morning. Pt falls asleep during assessment. Pt becomes agitated when you wake him up.  172/90 HR 75 91% RA CBG 139

## 2020-12-18 NOTE — ED Notes (Signed)
Patient requesting to go to bathroom. Urinal given, side rail down so patient can go. Advised to let me know if he needs anything. Patient verbalized understanding.

## 2020-12-18 NOTE — ED Notes (Signed)
Patient woke up yelling for water after Narcan .04 mg given IV , started vomiting , patient is yelling cussing wanting water, gave just enough water to rinse out his mouth. Denies any drug use.

## 2020-12-18 NOTE — ED Provider Notes (Addendum)
  Physical Exam  BP (!) 165/96   Pulse 70   Temp (!) 97.5 F (36.4 C) (Axillary)   Resp 10   SpO2 98%   Physical Exam  ED Course/Procedures     .Critical Care Performed by: Derwood Kaplan, MD Authorized by: Derwood Kaplan, MD   Critical care provider statement:    Critical care time (minutes):  32   Critical care was time spent personally by me on the following activities:  Discussions with consultants, evaluation of patient's response to treatment, examination of patient, ordering and performing treatments and interventions, ordering and review of laboratory studies, ordering and review of radiographic studies, pulse oximetry, re-evaluation of patient's condition, obtaining history from patient or surrogate and review of old charts    MDM   Assuming care of patient from Dr. London Pepper.   Patient in the ED for AMS. Family member called with ams. Pt lives by himself. Hx of substance abuse. Per EMS - pt was wobbly, hard to direct when they arrived. Received narcan - initial improvement in mentation and then fell asleep. 2nd round of narcan, no change. + arousable, follows commands.  Workup thus far shows : normal labs.   Concerning findings are as following: none Important pending results : Ct head  According to Dr. London Pepper, plan is to - admit for ams likely 2/2 toxic encephalopathy.   Patient had no complains, no concerns from the nursing side. Will continue to monitor.    Derwood Kaplan, MD 12/18/20 1527  6:25 PM Pt reassessed. He is arousable, answers my questions appropriately.  CT head done, negative for any acute intracranial bleed.  Labs reviewed with the patient.  He denies any substance use, but the UDS is positive for cocaine, amphetamines and THC.   I spoke with patient's wife, Ms. Linton Rump.  She will pick them up. Patient has been in the ER for more than 7 hours, he has not had any respiratory failure and as mentioned, he is now arousable.  Wife is willing to be  the responsible party to take him home.   Derwood Kaplan, MD 12/18/20 Claria Dice    Derwood Kaplan, MD 12/18/20 [id number]

## 2020-12-18 NOTE — ED Notes (Signed)
First encounter with patient. Patient resting in cart. Oxygen saturation dropped, placed on 2L at this time. Patient easily woken up. VSS will continue to monitor. No needs at this time.

## 2020-12-18 NOTE — ED Notes (Signed)
Patient falls asleep however is easily arousal.

## 2020-12-18 NOTE — Discharge Instructions (Signed)
You are brought into the ER by EMS. You have been washed in the emergency room for more than 7 hours.  Trauma scans and blood work is reassuring. We suspect that your change in mental status could be because of toxic ingestion.

## 2020-12-18 NOTE — ED Provider Notes (Addendum)
[facility] EMERGENCY DEPARTMENT Provider Note   CSN: [id number] Arrival date & time: 12/18/20  1037     History No chief complaint on file.   Zachary Sutton is a 65 y.o. male.  Patient brought in by [facility] for altered mental status.  Patient's wife was talking to him on the phone and noticed that he seemed to be disoriented.  EMS said that his gait is very unsteady.  Patient does have a history of alcohol abuse he admits to drinking some alcohol last night but said it was not excessive.  There was no evidence of significant alcohol paraphernalia at his house.  He lives by himself.  His wife last spoke to him 2 days ago when he was normal at that time.  Patient denies any pain here.  But he is very drowsy but he will wake up and follow commands he will raise both legs raise both arms.  Patient supposedly also has a history of sleep apnea although not listed on his problem list.  Again patient denies any pain.  Blood sugar in route was normal.  He apparently would desat in route.  But arrived here without any oxygen and his oxygen sats upon arrival here were 94%        No past medical history on file.  Patient Active Problem List   Diagnosis Date Noted  . Erectile dysfunction 09/20/2016  . Chronic hepatitis C without hepatic coma (HCC) 10/12/2015  . Calculus of gallbladder without cholecystitis without obstruction 10/12/2015    No past surgical history on file.     No family history on file.  Social History   Tobacco Use  . Smoking status: Never Smoker  . Smokeless tobacco: Never Used    Home Medications Prior to Admission medications   Medication Sig Start Date End Date Taking? Authorizing Provider  ALPRAZolam (XANAX) 0.5 MG tablet TAKE 1 TABLET(0.5 MG) BY MOUTH TWICE DAILY AS NEEDED FOR ANXIETY 09/07/20   Ronnald Nian, MD  amoxicillin-clavulanate (AUGMENTIN) 875-125 MG tablet Take 1 tablet by mouth 2 (two) times daily. 12/03/19   Ronnald Nian, MD  chlorpheniramine-HYDROcodone (TUSSIONEX PENNKINETIC ER) 10-8 MG/5ML SUER Take 5 mLs by mouth every 12 (twelve) hours as needed for cough. 12/03/19   Ronnald Nian, MD  ibuprofen (ADVIL) 200 MG tablet Take 200 mg by mouth every 6 (six) hours as needed.    [provider]  sildenafil (REVATIO) 20 MG tablet TAKE 1 TABLET (20 MG TOTAL) BY MOUTH 3 (THREE) TIMES DAILY. 05/15/20   Ronnald Nian, MD    Allergies    Patient has no known allergies.  Review of Systems   Review of Systems  Unable to perform ROS: Mental status change    Physical Exam Updated Vital Signs BP (!) 166/94 (BP Location: Left Arm)   Pulse 75   Temp (!) 97.5 F (36.4 C) (Axillary)   Resp 15   SpO2 94%   Physical Exam Vitals and nursing note reviewed.  Constitutional:      Appearance: Normal appearance. He is well-developed.     Comments: Patient drowsy but will awaken to follow commands.  Will verbalize  HENT:     Head: Normocephalic and atraumatic.     Mouth/Throat:     Mouth: Mucous membranes are dry.  Eyes:     Extraocular Movements: Extraocular movements intact.     Conjunctiva/sclera: Conjunctivae normal.     Pupils: Pupils are equal, round, and reactive  to light.  Cardiovascular:     Rate and Rhythm: Normal rate and regular rhythm.     Heart sounds: No murmur heard.   Pulmonary:     Effort: Pulmonary effort is normal. No respiratory distress.     Breath sounds: Normal breath sounds.  Abdominal:     Palpations: Abdomen is soft.     Tenderness: There is no abdominal tenderness.  Musculoskeletal:        General: No swelling. Normal range of motion.     Cervical back: Normal range of motion and neck supple.  Skin:    General: Skin is warm and dry.  Neurological:     General: No focal deficit present.     Comments: No gross focal deficit patient very drowsy.  But will awake to follow commands.  Patient will verbalize.     ED Results / Procedures / Treatments   Labs (all labs  ordered are listed, but only abnormal results are displayed) Labs Reviewed  CBG MONITORING, ED - Abnormal; Notable for the following components:      Result Value   Glucose-Capillary 135 (*)    All other components within normal limits  RESP PANEL BY RT-PCR (FLU A&B, COVID) ARPGX2  COMPREHENSIVE METABOLIC PANEL  LIPASE, BLOOD  CBC WITH DIFFERENTIAL/PLATELET  ETHANOL  RAPID URINE DRUG SCREEN, HOSP PERFORMED  PROTIME-INR  CBG MONITORING, ED    EKG EKG Interpretation  Date/Time:  Friday Dec 18 2020 10:57:38 EDT Ventricular Rate:  77 PR Interval:  154 QRS Duration: 108 QT Interval:  412 QTC Calculation: 466 R Axis:   14 Text Interpretation: Normal sinus rhythm Incomplete right bundle branch block Borderline ECG No previous ECGs available Confirmed by Vanetta Mulders [phone]) on 12/18/2020 11:01:06 AM   Radiology No results found.  Procedures Procedures   CRITICAL CARE Performed by: Vanetta Mulders Total critical care time:  45 minutes Critical care time was exclusive of separately billable procedures and treating other patients. Critical care was necessary to treat or prevent imminent or life-threatening deterioration. Critical care was time spent personally by me on the following activities: development of treatment plan with patient and/or surrogate as well as nursing, discussions with consultants, evaluation of patient's response to treatment, examination of patient, obtaining history from patient or surrogate, ordering and performing treatments and interventions, ordering and review of laboratory studies, ordering and review of radiographic studies, pulse oximetry and re-evaluation of patient's condition.   Medications Ordered in ED Medications  0.9 %  sodium chloride infusion (has no administration in time range)  sodium chloride 0.9 % bolus 500 mL (has no administration in time range)  naloxone (NARCAN) injection 0.4 mg (has no administration in time range)    ED  Course  I have reviewed the triage vital signs and the nursing notes.  Pertinent labs & imaging results that were available during my care of the patient were reviewed by me and considered in my medical decision making (see chart for details).    MDM Rules/Calculators/A&P                          Patient with abnormal gait according to EMS.  Altered mental status here.  May be alcohol related.  Patient out of the window for any code stroke last normal was 2 days ago.  Will begin work-up.  IV fluids check alcohol level CT head chest x-ray.  We will also give a dose of Narcan.  Check blood sugar.  Patient woke up significantly with Narcan.  Now is irate.  Denied doing any illicit drugs however.  Continue with work-up.  Will continue to observe.  Patient may become very somnolent again when the Narcan wears off.  Patient became somnolent again.  Given Narcan again.  He will wake up he will talk.  Oxygen sats with 2 L are remaining at about 91%.  Patient does have a prescription for Xanax.  Source possibly could have taken some of that as well.  Kind of waiting for head CT results before we can consider admission.  But if patient wakes up and everything is negative he can probably be discharged home.  Blood alcohol level was not elevated.  Labs without any significant abnormalities other than a mild leukocytosis with a white count of 11.9.  COVID testings and influenza testing is negative.  Chest x-ray without any acute findings.  Clinically suspect patient is substance abuse as the cause of the altered mental status.  Final Clinical Impression(s) / ED Diagnoses Final diagnoses:  Altered mental status, unspecified altered mental status type    Rx / DC Orders ED Discharge Orders    None       Vanetta Mulders, MD 12/18/20 1111    Vanetta Mulders, MD 12/18/20 1341

## 2020-12-18 NOTE — ED Notes (Signed)
Pt transported to CT ?

## 2021-03-14 ENCOUNTER — Other Ambulatory Visit: Payer: Self-pay | Admitting: Family Medicine

## 2021-03-14 DIAGNOSIS — N529 Male erectile dysfunction, unspecified: Secondary | ICD-10-CM

## 2021-03-15 NOTE — Telephone Encounter (Signed)
[facility] is requesting to fill pt sildenafil. Please advise KH 

## 2021-05-11 ENCOUNTER — Telehealth: Payer: Self-pay | Admitting: Family Medicine

## 2021-05-11 NOTE — Telephone Encounter (Signed)
Pt requested records to sent from [facility]

## 2021-05-15 ENCOUNTER — Other Ambulatory Visit: Payer: Self-pay | Admitting: Family Medicine

## 2021-05-15 DIAGNOSIS — N529 Male erectile dysfunction, unspecified: Secondary | ICD-10-CM

## 2021-05-17 NOTE — Telephone Encounter (Signed)
[facility] is requesting to fill pt sildenafil. Please advise KH 

## 2021-05-19 ENCOUNTER — Telehealth: Payer: Self-pay | Admitting: Family Medicine

## 2021-05-19 NOTE — Telephone Encounter (Signed)
Records received from [facility]

## 2021-06-28 ENCOUNTER — Other Ambulatory Visit: Payer: Self-pay | Admitting: Family Medicine

## 2021-06-28 DIAGNOSIS — N529 Male erectile dysfunction, unspecified: Secondary | ICD-10-CM

## 2021-06-28 NOTE — Telephone Encounter (Signed)
Pt was sent a my chart message to advise of the need for a med check. He advised he is on house arrest and has to get permission to leave the county. Can he do a virtual med check and schedule labs later?

## 2021-06-28 NOTE — Telephone Encounter (Signed)
Needs appt and sent my chart message. KH

## 2021-11-11 IMAGING — DX DG CHEST 1V PORT
1 series · 1 of 1 positions shown · non-contrast
Comparison: None

CLINICAL DATA: Altered mental status in a 65-year-old male.

EXAM:
PORTABLE CHEST 1 VIEW

[chest]
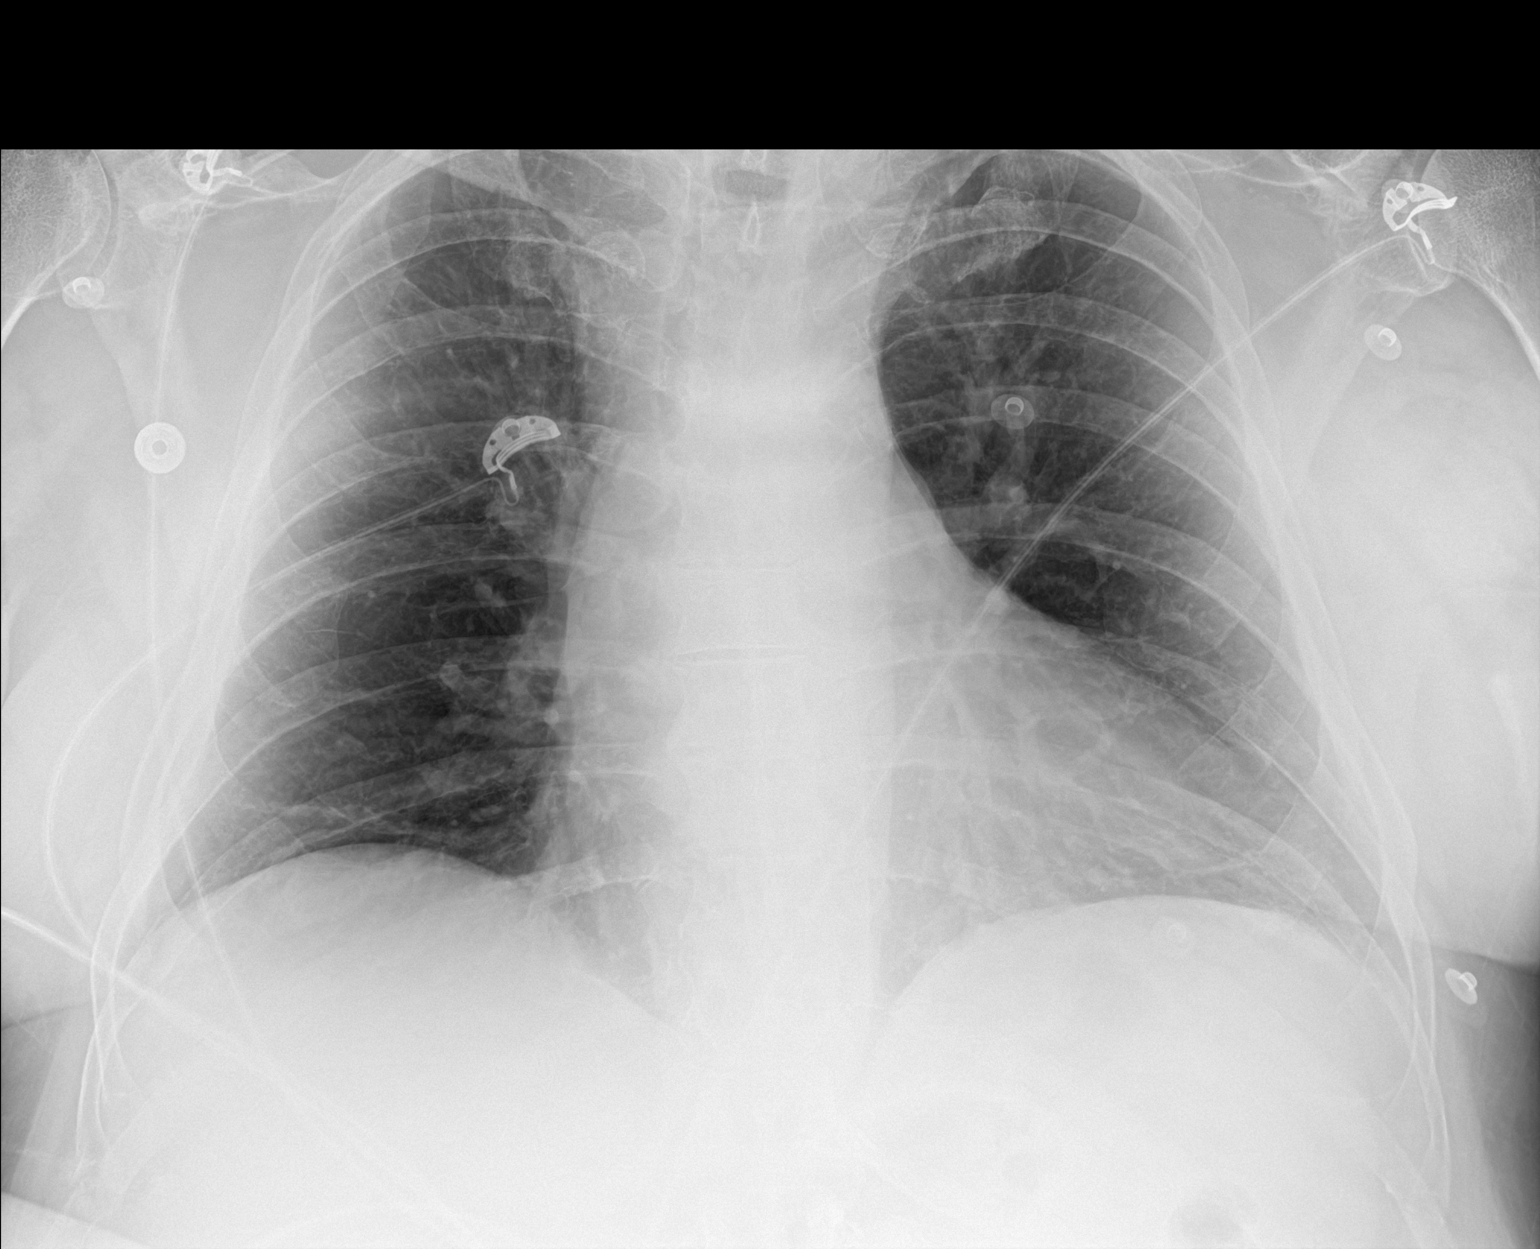

[1 of 1 positions shown; findings below may reference images not displayed]

FINDINGS: Cardiomediastinal contours are normal. EKG leads project over the
chest.

Hilar structures are unremarkable.

Lungs are clear.  No sign of effusion on frontal radiograph.

Linear airspace disease at the lung bases bilaterally.

On limited assessment no acute skeletal process.
IMPRESSION: Basilar atelectasis without acute cardiopulmonary disease.

## 2022-04-20 ENCOUNTER — Encounter: Payer: Self-pay | Admitting: Internal Medicine

## 2022-05-24 ENCOUNTER — Encounter: Payer: Self-pay | Admitting: Internal Medicine

## 2022-06-06 ENCOUNTER — Encounter: Payer: Self-pay | Admitting: Internal Medicine

## 2023-10-10 ENCOUNTER — Encounter: Payer: Self-pay | Admitting: Internal Medicine
